# Patient Record
Sex: Male | Born: 1980 | Race: White | Hispanic: No | Marital: Single | Smoking: Former smoker
Health system: Southern US, Community
[De-identification: ages and names within clinical notes are randomized; demographics above are authoritative.]

## PROBLEM LIST (undated history)

## (undated) HISTORY — PX: TENDON REPAIR: SHX5111

---

## 2017-11-14 ENCOUNTER — Other Ambulatory Visit: Payer: Self-pay

## 2017-11-14 ENCOUNTER — Encounter (HOSPITAL_COMMUNITY): Payer: Self-pay | Admitting: Emergency Medicine

## 2017-11-14 ENCOUNTER — Emergency Department (HOSPITAL_COMMUNITY): Payer: Self-pay

## 2017-11-14 ENCOUNTER — Emergency Department (HOSPITAL_COMMUNITY)
Admission: EM | Admit: 2017-11-14 | Discharge: 2017-11-14 | Disposition: A | Discharge status: Home / Self Care | Payer: Self-pay | Attending: Emergency Medicine | Admitting: Emergency Medicine

## 2017-11-14 DIAGNOSIS — R61 Generalized hyperhidrosis: Secondary | ICD-10-CM | POA: Insufficient documentation

## 2017-11-14 DIAGNOSIS — R42 Dizziness and giddiness: Secondary | ICD-10-CM | POA: Insufficient documentation

## 2017-11-14 DIAGNOSIS — R0602 Shortness of breath: Secondary | ICD-10-CM | POA: Insufficient documentation

## 2017-11-14 DIAGNOSIS — Z87891 Personal history of nicotine dependence: Secondary | ICD-10-CM | POA: Insufficient documentation

## 2017-11-14 DIAGNOSIS — R002 Palpitations: Secondary | ICD-10-CM | POA: Insufficient documentation

## 2017-11-14 DIAGNOSIS — R251 Tremor, unspecified: Secondary | ICD-10-CM | POA: Insufficient documentation

## 2017-11-14 LAB — BASIC METABOLIC PANEL
ANION GAP: 10 (ref 5–15)
BUN: 13 mg/dL (ref 6–20)
CALCIUM: 9.5 mg/dL (ref 8.9–10.3)
CO2: 25 mmol/L (ref 22–32)
CREATININE: 0.98 mg/dL (ref 0.61–1.24)
Chloride: 103 mmol/L (ref 98–111)
GFR calc Af Amer: >60 mL/min (ref >60)
GLUCOSE: 152 mg/dL — AB (ref 70–99)
Potassium: 3.9 mmol/L (ref 3.5–5.1)
Sodium: 138 mmol/L (ref 135–145)

## 2017-11-14 LAB — CBC
HCT: 43.7 % (ref 39.0–52.0)
Hemoglobin: 15 g/dL (ref 13.0–17.0)
MCH: 34.1 pg — AB (ref 26.0–34.0)
MCHC: 34.3 g/dL (ref 30.0–36.0)
MCV: 99.3 fL (ref 78.0–100.0)
PLATELETS: 120 10*3/uL — AB (ref 150–400)
RBC: 4.4 MIL/uL (ref 4.22–5.81)
RDW: 13.1 % (ref 11.5–15.5)
WBC: 7.1 10*3/uL (ref 4.0–10.5)

## 2017-11-14 LAB — I-STAT TROPONIN, ED
TROPONIN I, POC: 0 ng/mL (ref 0.00–0.08)
Troponin i, poc: 0 ng/mL (ref 0.00–0.08)

## 2017-11-14 LAB — D-DIMER, QUANTITATIVE: D-Dimer, Quant: 0.27 ug/mL-FEU (ref 0.00–0.50)

## 2017-11-14 NOTE — ED Provider Notes (Signed)
[facility] EMERGENCY DEPARTMENT Provider Note   CSN: [id number] Arrival date & time: 11/14/17  1135     History   Chief Complaint Chief Complaint  Patient presents with  . Palpitations  . Shortness of Breath    HPI Brian Small is a 37 y.o. male past medical history of alcohol abuse who presents emergency department today for palpitations and shortness of breath.  Patient states that he was on a car trip with his family down from [place] we had the sudden onset of tremors, diaphoresis, shortness breath, lightheadedness and the sensation he was going to pass out.  He reports that this lasted for approximately 30 minutes before ceasing after treating himself with an Ativan.  He attempted to go to urgent care was told to come to the emergency department.  He does this is never happened before.  He denies any associated headache, visual changes, difficulty with speech, hearing changes, tinnitus, facial droop, vertigo, neck pain, chest pain, cough, hemoptysis, abdominal pain, nausea/vomiting/diarrhea, lower leg swelling.  Patient does note that he usually drinks 12-15 alcohol beverages per day.  His last alcoholic beverage was yesterday night at 10 PM.  He denies any history of alcohol withdrawal seizures.  Patient denies any drug use.  He does not take any daily medications.  He denies any over-the-counter medication use.  He denies any caffeine use including sodas, coffee or tea.  Patient denies any other symptoms at this current time.  He is currently asymptomatic.  HPI  History reviewed. No pertinent past medical history.  There are no active problems to display for this patient.   Past Surgical History:  Procedure Laterality Date  . TENDON REPAIR Right    hand        Home Medications    Prior to Admission medications   Not on File    Family History No family history on file.  Social History Social History   Tobacco Use  . Smoking status:  Former Smoker    Packs/day: 1.00    Types: Cigarettes    Last attempt to quit: 07/02/2017    Years since quitting: 0.3  . Smokeless tobacco: Never Used  Substance Use Topics  . Alcohol use: Yes    Alcohol/week: 7.2 - 9.0 oz    Types: 12 - 15 Cans of beer per week  . Drug use: Not Currently     Allergies   Penicillins   Review of Systems Review of Systems  All other systems reviewed and are negative.    Physical Exam Updated Vital Signs BP 117/78 (BP Location: Left Arm)   Pulse 74   Temp 98.4 F (36.9 C) (Oral)   Resp 19   Ht 6\' 1"  (1.854 m)   Wt 83.9 kg (185 lb)   SpO2 100%   BMI 24.41 kg/m   Physical Exam  Constitutional: He appears well-developed and well-nourished.  HENT:  Head: Normocephalic and atraumatic.  Right Ear: External ear normal.  Left Ear: External ear normal.  Nose: Nose normal.  Mouth/Throat: Uvula is midline, oropharynx is clear and moist and mucous membranes are normal. No tonsillar exudate.  Eyes: Pupils are equal, round, and reactive to light. Right eye exhibits no discharge. Left eye exhibits no discharge. No scleral icterus.  Neck: Trachea normal. Neck supple. No spinous process tenderness present. No neck rigidity. Normal range of motion present.  Cardiovascular: Normal rate, regular rhythm and intact distal pulses.  No murmur heard. Pulses:  Radial pulses are 2+ on the right side, and 2+ on the left side.       Dorsalis pedis pulses are 2+ on the right side, and 2+ on the left side.       Posterior tibial pulses are 2+ on the right side, and 2+ on the left side.  No lower extremity swelling or edema. Calves symmetric in size bilaterally.  Pulmonary/Chest: Effort normal and breath sounds normal. He exhibits no tenderness.  Abdominal: Soft. Bowel sounds are normal. There is no tenderness. There is no rigidity, no rebound, no guarding and no CVA tenderness.  Musculoskeletal: He exhibits no edema.  Lymphadenopathy:    He has no  cervical adenopathy.  Neurological: He is alert.  Mental Status:  Alert, oriented, thought content appropriate, able to give a coherent history. Speech fluent without evidence of aphasia. Able to follow 2 step commands without difficulty.  Cranial Nerves:  II:  Peripheral visual fields grossly normal, pupils equal, round, reactive to light III,IV, VI: ptosis not present, extra-ocular motions intact bilaterally  V,VII: smile symmetric, eyebrows raise symmetric, facial light touch sensation equal VIII: hearing grossly normal to voice  X: uvula elevates symmetrically  XI: bilateral shoulder shrug symmetric and strong XII: midline tongue extension without fassiculations Motor:  Normal tone. 5/5 in upper and lower extremities bilaterally including strong and equal grip strength and dorsiflexion/plantar flexion Sensory: Sensation intact to light touch in all extremities. Negative Romberg.  Deep Tendon Reflexes: 2+ and symmetric in the biceps and patella Cerebellar: normal finger-to-nose with bilateral upper extremities. Normal heel-to -shin balance bilaterally of the lower extremity. No pronator drift.  Gait: normal gait and balance CV: distal pulses palpable throughout   Skin: Skin is warm and dry. Capillary refill takes less than 2 seconds. No rash noted. He is not diaphoretic.  Psychiatric: He has a normal mood and affect.  Nursing note and vitals reviewed.    ED Treatments / Results  Labs (all labs ordered are listed, but only abnormal results are displayed) Labs Reviewed  BASIC METABOLIC PANEL - Abnormal; Notable for the following components:      Result Value   Glucose, Bld 152 (*)    All other components within normal limits  CBC - Abnormal; Notable for the following components:   MCH 34.1 (*)    Platelets 120 (*)    All other components within normal limits  D-DIMER, QUANTITATIVE (NOT AT [facility])  I-STAT TROPONIN, ED    EKG None  Radiology Dg Chest 2 View  Result Date:  11/14/2017 CLINICAL DATA:  Patient today states arm numbness and body aches. Says his heart rate is fast and all started today. Previous smoker quit 3 months ago. EXAM: CHEST - 2 VIEW COMPARISON:  none FINDINGS: Lungs are clear. Heart size and mediastinal contours are within normal limits. No effusion. Visualized bones unremarkable. IMPRESSION: No acute cardiopulmonary disease. Electronically Signed   By: Corlis Leak M.D.   On: 11/14/2017 12:45    Procedures Procedures (including critical care time)  Medications Ordered in ED Medications - No data to display   Initial Impression / Assessment and Plan / ED Course  I have reviewed the triage vital signs and the nursing notes.  Pertinent labs & imaging results that were available during my care of the patient were reviewed by me and considered in my medical decision making (see chart for details).     37 year old male with a history of alcohol abuse presenting with episode of tremors, diaphoresis,  shortness breath, lightheadedness and sensation he was going to pass out.  This lasted for approximately 30 minutes before being relieved after treating himself with Ativan.  Patient vital signs are reassuring on presentation.  Patient is currently asymptomatic.  Patient is neurologically intact.  Heart is regular rate and rhythm.  Lungs clear to auscultation bilaterally.  Abdomen is soft and nontender.  Normal neurologic exam. Patient denies any illicit drug use, over-the-counter medication use or prescription medication.  He denies any caffeine use.  He denies history of the same.  He denies any other symptoms.  EKG is NSR.  Chest x-ray without any cardiopulmonary disease.  No leukocytosis.  No anemia.  No significant electrolyte derangements.  No acute kidney injury.  No evidence of DKA. Tn wnl. D-Dimer negative. Do not suspect PE.   Symptoms appear to be related to possible alcohol withdrawal that was treated with Ativan, panic attack, 1 versus  presyncope.  Patient is currently asymptomatic.  His CIWA level is 1. Report Tn wnl. No elevation. Do not suspect ACS.  Patient without history of heart failure, normal hemoglobin/hematocrit and is without any arrhythmia or tachycardia in the department.  The evaluation does not show pathology that would require ongoing emergent intervention or inpatient treatment. I advised the patient to follow-up with PCP this week. I advised the patient to return to the emergency department with new or worsening symptoms or new concerns. Specific return precautions discussed. The patient verbalized understanding and agreement with plan. All questions answered. No further questions at this time. The patient is hemodynamically stable, mentating appropriately and appears safe for discharge.  Final Clinical Impressions(s) / ED Diagnoses   Final diagnoses:  Palpitations  Shortness of breath    ED Discharge Orders    None       Princella PellegriniMaczis, Darcy Barbara M, PA-C 11/14/17 1621    Shaune PollackIsaacs, Cameron, MD 11/15/17 [phone]

## 2017-11-14 NOTE — ED Triage Notes (Signed)
Patient to ED c/o sudden onset palpitations, shortness of breath, lightheadedness, and diaphoresis while sitting in car about an hour ago. Patient on road trip, states this has never happened before, no medical history, but he reports he's a former smoker and drinks about 12-15 beers a day. He took a non-prescribed Ativan shortly after symptoms began with relief. He denies CP, but endorses bilateral arm weakness and tingling. Reports he still feels short of breath at rest. Resp e/u, skin warm/dry.

## 2017-11-14 NOTE — Discharge Instructions (Addendum)
Your lab work, EKG and chest x-ray were reassuring today. Follow up with your primary care doctor this week.  Avoid alcohol and caffeine use.  See attached handouts.  If you develop worsening or new concerning symptoms you can return to the emergency department for re-evaluation.

## 2017-11-14 NOTE — ED Notes (Signed)
Main lab to add on D-dimer lab.

## 2018-11-24 IMAGING — DX DG CHEST 2V
2 series · 2 of 2 positions shown · non-contrast
Comparison: none

CLINICAL DATA: Patient today states arm numbness and body aches.
Says his heart rate is fast and all started today. Previous smoker
quit 3 months ago.

EXAM:
CHEST - 2 VIEW

[chest pa]
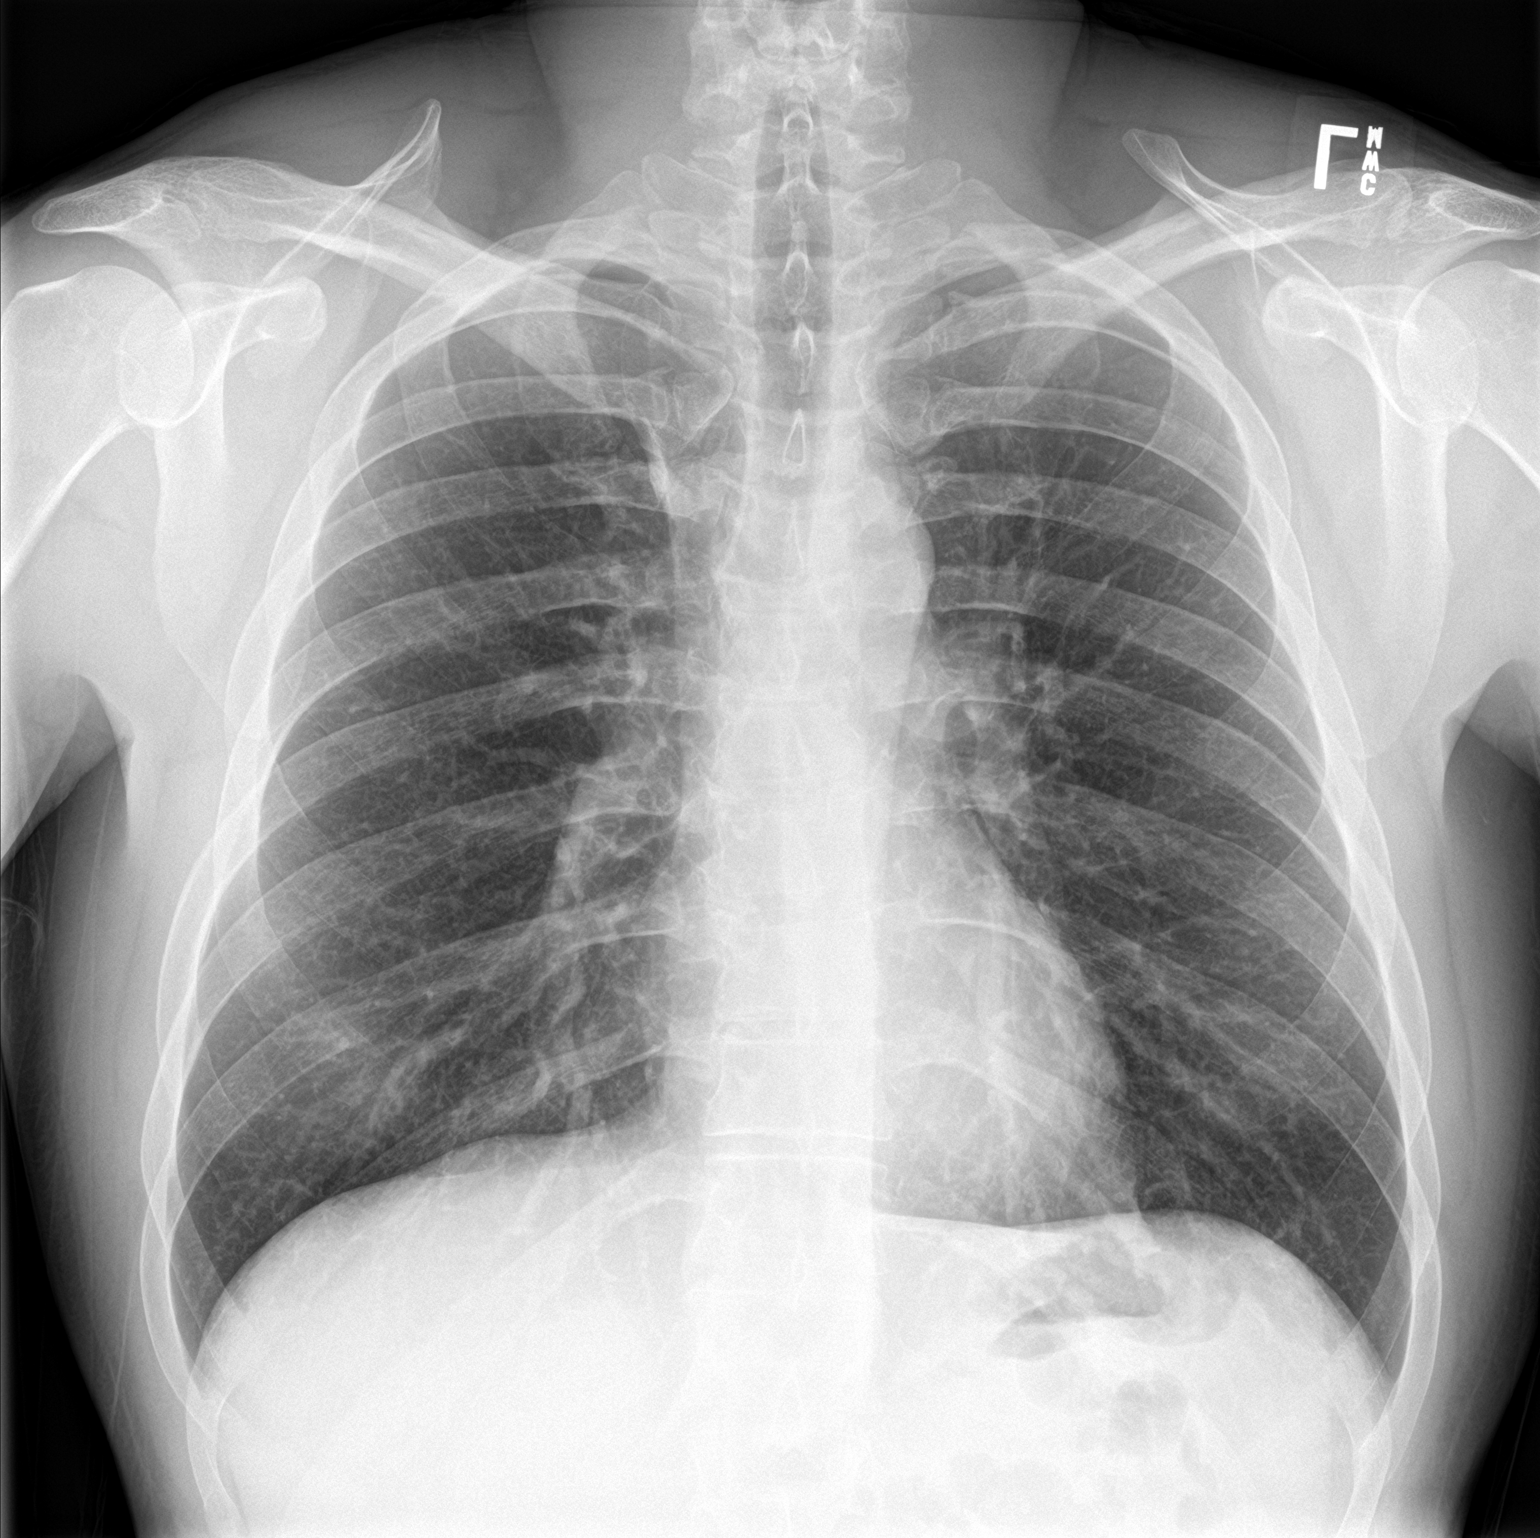

[chest lat]
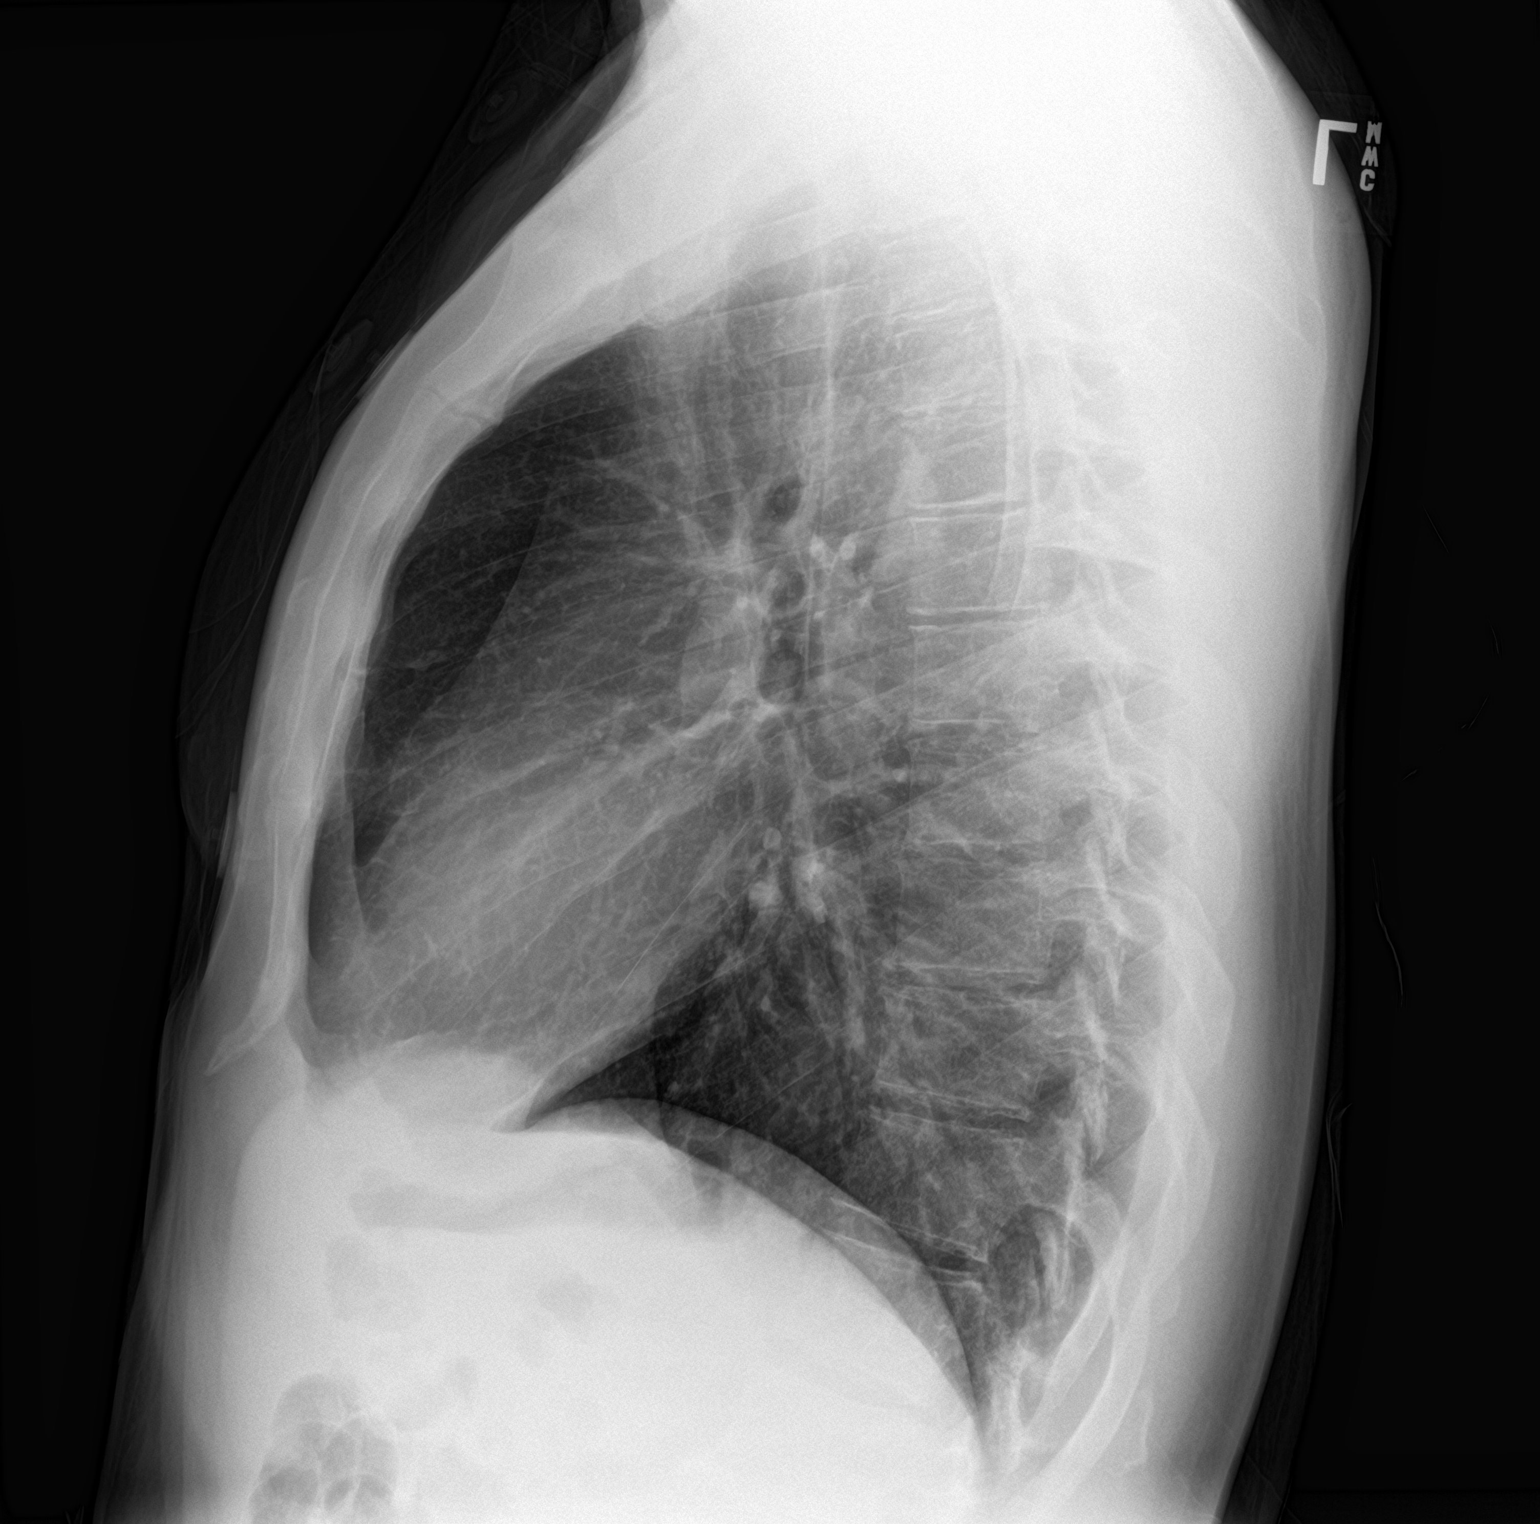

[2 of 2 positions shown; findings below may reference images not displayed]

FINDINGS: Lungs are clear.

Heart size and mediastinal contours are within normal limits.

No effusion.

Visualized bones unremarkable.
IMPRESSION: No acute cardiopulmonary disease.
